# Patient Record
Sex: Female | Born: 1990 | Race: White | Hispanic: No | Marital: Married | Smoking: Current every day smoker
Health system: Southern US, Community
[De-identification: ages and names within clinical notes are randomized; demographics above are authoritative.]

---
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
## 2009-06-28 ENCOUNTER — Emergency Department (HOSPITAL_COMMUNITY): Admission: EM | Admit: 2009-06-28 | Discharge: 2009-06-29 | Payer: Self-pay | Admitting: Emergency Medicine

## 2009-06-30 ENCOUNTER — Ambulatory Visit (HOSPITAL_COMMUNITY): Admission: RE | Admit: 2009-06-30 | Discharge: 2009-06-30 | Payer: Self-pay | Admitting: Emergency Medicine

## 2010-11-18 LAB — POCT PREGNANCY, URINE: Preg Test, Ur: NEGATIVE

## 2010-11-18 LAB — COMPREHENSIVE METABOLIC PANEL
AST: 18 U/L (ref 0–37)
Chloride: 106 mEq/L (ref 96–112)
Creatinine, Ser: 0.73 mg/dL (ref 0.4–1.2)
GFR calc non Af Amer: >60 mL/min (ref >60)
Glucose, Bld: 100 mg/dL — ABNORMAL HIGH (ref 70–99)
Total Protein: 7.5 g/dL (ref 6.0–8.3)

## 2010-11-18 LAB — URINALYSIS, ROUTINE W REFLEX MICROSCOPIC
Bilirubin Urine: NEGATIVE
Hgb urine dipstick: NEGATIVE
Ketones, ur: NEGATIVE mg/dL
Nitrite: NEGATIVE
pH: 5.5 (ref 5.0–8.0)

## 2010-11-18 LAB — CBC
Hemoglobin: 15.1 g/dL — ABNORMAL HIGH (ref 12.0–15.0)
Platelets: 284 10*3/uL (ref 150–400)
RDW: 11.4 % — ABNORMAL LOW (ref 11.5–15.5)

## 2010-11-18 LAB — DIFFERENTIAL
Basophils Absolute: 0.1 10*3/uL (ref 0.0–0.1)
Basophils Relative: 2 % — ABNORMAL HIGH (ref 0–1)
Eosinophils Absolute: 0.6 10*3/uL (ref 0.0–0.7)
Lymphocytes Relative: 36 % (ref 12–46)
Lymphs Abs: 2.6 10*3/uL (ref 0.7–4.0)
Monocytes Absolute: 0.6 10*3/uL (ref 0.1–1.0)
Neutro Abs: 3.4 10*3/uL (ref 1.7–7.7)

## 2010-11-18 LAB — LIPASE, BLOOD: Lipase: 29 U/L (ref 11–59)

## 2011-01-01 NOTE — Discharge Summary (Signed)
Amy Tanner, Amy Tanner                 ACCOUNT NO.:  [id number]   MEDICAL RECORD NO.:  [id number]          PATIENT TYPE:  OBV   LOCATION:  5017                         FACILITY:  [facility]   PHYSICIAN:  Pediatrics Resident    DATE OF BIRTH:  [birth year]   DATE OF ADMISSION:  09/20/2006  DATE OF DISCHARGE:  09/21/2006                               DISCHARGE SUMMARY   REASON FOR ADMISSION:  Accidental rubbing alcohol ingestion, vomiting,  headache.   SIGNIFICANT FINDINGS:  The patient was admitted after accidental  ingestion of approximately 2 ounces of rubbing alcohol.  She had  continued vomiting and complained of abdominal pain.  CT of the abdomen  was negative except for minimal ground-glass opacity in the right lower  lobe.  A subsequent chest x-ray was negative.  The patient had normal  work of breathing with no respiratory difficulties. A CBC showed  slightly increased platelets and slightly increased white blood cell  count consistent with demarginalization.  A chemistry ISTAT and  urinalysis were all negative.   TREATMENT:  The patient was given IV hydration and Zofran as needed.  She received Toradol in the emergency department for headache.   OPERATION/PROCEDURE:  None.   FINAL DIAGNOSES:  1. Accidental rubbing alcohol ingestion.  2. Dehydration secondary to vomiting.   DISCHARGE MEDICATIONS/INSTRUCTIONS:  She should seek medical care if  she has any changes in mental status, continued vomiting, abdominal pain  or persistent headache.   FINDINGS/RESULTS TO BE FOLLOWED:  None.   FOLLOWUP:  She is to follow as needed with Dr. Lorin Picket at [facility].   DISCHARGE WEIGHT:  Discharge weight 145 pounds.   DISCHARGE CONDITION:  Good.  This was faxed to her primary care  physician, Dr. Lorin Picket, [facility] on September 21, 2006.           ______________________________  Pediatrics Resident     PR/MEDQ  D:  09/21/2006  T:  09/22/2006  Job:  [id number]

## 2011-05-25 LAB — CBC
HCT: 37.1
Hemoglobin: 12.9
MCHC: 34.7
MCV: 82.8
Platelets: 401 — ABNORMAL HIGH
RBC: 4.48
RDW: 12.2
WBC: 6.6

## 2011-05-25 LAB — URINALYSIS, ROUTINE W REFLEX MICROSCOPIC
Bilirubin Urine: NEGATIVE
Glucose, UA: NEGATIVE
Hgb urine dipstick: NEGATIVE
Ketones, ur: NEGATIVE
Nitrite: NEGATIVE
Protein, ur: NEGATIVE
Specific Gravity, Urine: 1.028
Urobilinogen, UA: 1
pH: 6.5

## 2011-05-25 LAB — BASIC METABOLIC PANEL WITH GFR
BUN: 11
CO2: 26
Calcium: 8.6
Chloride: 104
Creatinine, Ser: 0.7
Glucose, Bld: 106 — ABNORMAL HIGH
Potassium: 3.3 — ABNORMAL LOW
Sodium: 135

## 2011-05-25 LAB — DIFFERENTIAL
Basophils Absolute: 0
Basophils Relative: 1
Eosinophils Absolute: 0.2
Eosinophils Relative: 3
Monocytes Absolute: 0.7

## 2011-05-25 LAB — URINE MICROSCOPIC-ADD ON

## 2017-04-10 ENCOUNTER — Inpatient Hospital Stay (HOSPITAL_COMMUNITY)
Admission: AD | Admit: 2017-04-10 | Discharge: 2017-04-13 | DRG: 881 | Disposition: A | Discharge status: Home / Self Care | Payer: Commercial Managed Care - PPO | Source: Intra-hospital | Attending: Psychiatry | Admitting: Psychiatry

## 2017-04-10 DIAGNOSIS — F332 Major depressive disorder, recurrent severe without psychotic features: Secondary | ICD-10-CM | POA: Diagnosis not present

## 2017-04-10 DIAGNOSIS — Z885 Allergy status to narcotic agent status: Secondary | ICD-10-CM | POA: Diagnosis not present

## 2017-04-10 DIAGNOSIS — Z79899 Other long term (current) drug therapy: Secondary | ICD-10-CM

## 2017-04-10 DIAGNOSIS — F419 Anxiety disorder, unspecified: Secondary | ICD-10-CM | POA: Diagnosis present

## 2017-04-10 DIAGNOSIS — F1310 Sedative, hypnotic or anxiolytic abuse, uncomplicated: Secondary | ICD-10-CM | POA: Diagnosis not present

## 2017-04-10 DIAGNOSIS — F329 Major depressive disorder, single episode, unspecified: Secondary | ICD-10-CM | POA: Diagnosis present

## 2017-04-10 DIAGNOSIS — F39 Unspecified mood [affective] disorder: Secondary | ICD-10-CM | POA: Diagnosis not present

## 2017-04-10 DIAGNOSIS — G4700 Insomnia, unspecified: Secondary | ICD-10-CM | POA: Diagnosis not present

## 2017-04-10 DIAGNOSIS — Z91018 Allergy to other foods: Secondary | ICD-10-CM

## 2017-04-10 DIAGNOSIS — R059 Cough, unspecified: Secondary | ICD-10-CM

## 2017-04-10 DIAGNOSIS — R05 Cough: Secondary | ICD-10-CM

## 2017-04-10 DIAGNOSIS — Z915 Personal history of self-harm: Secondary | ICD-10-CM

## 2017-04-10 DIAGNOSIS — F17210 Nicotine dependence, cigarettes, uncomplicated: Secondary | ICD-10-CM | POA: Diagnosis present

## 2017-04-10 MED ORDER — MIRTAZAPINE 15 MG PO TABS
15.0000 mg | ORAL_TABLET | Freq: Every day | ORAL | Status: DC
  Administered 2017-04-10 – 2017-04-12 (×3): 15 mg via ORAL
  Filled 2017-04-10 (×6): qty 1

## 2017-04-10 MED ORDER — HYDROXYZINE HCL 25 MG PO TABS
25.0000 mg | ORAL_TABLET | Freq: Three times a day (TID) | ORAL | Status: DC | PRN
  Administered 2017-04-12: 25 mg via ORAL
  Filled 2017-04-10: qty 1

## 2017-04-10 MED ORDER — ALUM & MAG HYDROXIDE-SIMETH 200-200-20 MG/5ML PO SUSP: 30.0000 mL | ORAL | Status: DC | PRN

## 2017-04-10 MED ORDER — ACETAMINOPHEN 325 MG PO TABS: 650.0000 mg | ORAL_TABLET | Freq: Four times a day (QID) | ORAL | Status: DC | PRN

## 2017-04-10 MED ORDER — ESCITALOPRAM OXALATE 20 MG PO TABS
20.0000 mg | ORAL_TABLET | Freq: Every day | ORAL | Status: DC
  Administered 2017-04-11: 20 mg via ORAL
  Filled 2017-04-10 (×3): qty 1

## 2017-04-10 MED ORDER — MAGNESIUM HYDROXIDE 400 MG/5ML PO SUSP: 30.0000 mL | Freq: Every day | ORAL | Status: DC | PRN

## 2017-04-10 MED ORDER — OLANZAPINE 5 MG PO TABS
5.0000 mg | ORAL_TABLET | Freq: Every day | ORAL | Status: DC
  Administered 2017-04-10: 5 mg via ORAL
  Filled 2017-04-10 (×4): qty 1

## 2017-04-10 NOTE — BH Assessment (Signed)
Tele Assessment Note   Patient Name: Amy Tanner MRN: [id number] Referring Physician: Donnita Falls Location of Patient: BH-[id number] IP ADULT Location of Provider: Behavioral Health TTS Department  Galen Malkowski is an 26 y.o. female.   Summary per [facility] Assessment dated 04/10/17: "Patient was IVC'ed by husband.  She says he thinks that she has been up and down in her mood.  One minute happy then another minute irritable.  Patient says that she can tell that she has had some mood swings.  Patient says she has not had any SI for "several weeks."  She had an suicide attempt about 3-4 months ago after the birth of her daughter.  Patient was started on medications at [facility] at that time.  Patient denies current SI.  No plan or intention to harm herself.  Pt says she has no intentions to harm anyone else, no plan, no HI.  Patient denies any A/V hallucinations.  Patient has had a counselor at [facility] after the medication was started a few months ago.  She has no seen this counselor however for two months.  Patient currently not taking any medications becuase she did not feel it was doing her any good.  Patient denies any physical, emotional or sexual abuse history.  No hx of any SA issues.    Patient declines to have husband contacted at this time for additional information."    Diagnosis: Bipolar D/O; GAD  Past Medical History: No past medical history on file.  No past surgical history on file.  Family History: No family history on file.  Social History:  has no tobacco, alcohol, and drug history on file.  Additional Social History:  Alcohol / Drug Use Prescriptions: Kimberlee Nearing, REMERON History of alcohol / drug use?: No history of alcohol / drug abuse  CIWA:   COWS:    PATIENT STRENGTHS: (choose at least two) Average or above average intelligence Communication skills Supportive family/friends  Allergies: Allergies not on file  Home Medications:   No prescriptions prior to admission.    OB/GYN Status:  No LMP recorded.  General Assessment Data Location of Assessment: [facility] ([place]) TTS Assessment: Out of system Is this a Tele or Face-to-Face Assessment?: Tele Assessment Is this an Initial Assessment or a Re-assessment for this encounter?: Initial Assessment Marital status: Married Is patient pregnant?: Unknown Living Arrangements: Spouse/significant other, Children Can pt return to current living arrangement?: Yes Admission Status: Involuntary Is patient capable of signing voluntary admission?: Yes Referral Source: Self/Family/Friend Insurance type:  (MEDICAID)     Crisis Care Plan Living Arrangements: Spouse/significant other, Children Name of Psychiatrist:  ([facility] NOT SEEN FOR 2 [facility]) Name of Therapist:  (NONE REPORTED)  Education Status Is patient currently in school?:  (UTA) Highest grade of school patient has completed:  (UNK)  Risk to self with the past 6 months Suicidal Ideation: No-Not Currently/Within Last 6 Months Has patient been a risk to self within the past 6 months prior to admission? : Yes Suicidal Intent: No-Not Currently/Within Last 6 Months Has patient had any suicidal intent within the past 6 months prior to admission? : Yes Is patient at risk for suicide?: Yes Suicidal Plan?: No-Not Currently/Within Last 6 Months Has patient had any suicidal plan within the past 6 months prior to admission? : Yes Access to Means: Yes (PER RECORD, HAS A HANDGUN) Specify Access to Suicidal Means:  (NONE REPORTED CURRENTLY) What has been your use of drugs/alcohol within the last 12 months?:  (  NONE) Previous Attempts/Gestures: Yes How many times?:  (1 - 3 TO 4 MONTHS AGO) Other Self Harm Risks:  (NONE REPORTED) Triggers for Past Attempts: Unknown Intentional Self Injurious Behavior: None Family Suicide History: Unknown Recent stressful life event(s): Other (Comment) (BIRTH OF DAUGHTER  A FEW MONTHS AGO) Persecutory voices/beliefs?: No Depression: Yes Depression Symptoms: Tearfulness, Isolating, Feeling angry/irritable Substance abuse history and/or treatment for substance abuse?: No Suicide prevention information given to non-admitted patients: Not applicable  Risk to Others within the past 6 months Homicidal Ideation: No Does patient have any lifetime risk of violence toward others beyond the six months prior to admission? : No Thoughts of Harm to Others: Yes-Currently Present (PER HUSBAND, PT IS  A THREAT TO HER CHILDREN) Comment - Thoughts of Harm to Others:  (NO FURTHER DETAILS GIVEN) Current Homicidal Intent: No Current Homicidal Plan: No Access to Homicidal Means: Yes Describe Access to Homicidal Means:  (HANDGUN) Identified Victim:  (NOT REPORTED SPECIFICALLY) History of harm to others?: No (NONE REPORTED) Assessment of Violence: None Noted Does patient have access to weapons?: Yes (Comment) Criminal Charges Pending?: No Does patient have a court date: No Is patient on probation?: No  Psychosis Hallucinations: None noted Delusions: None noted  Mental Status Report Appearance/Hygiene:  (UTA) Eye Contact: Unable to Assess Motor Activity: Unable to assess Speech: Unable to assess Level of Consciousness: Alert Mood: Pleasant, Euthymic Affect: Labile Anxiety Level: Minimal Thought Processes: Coherent, Relevant Judgement: Unimpaired Orientation: Appropriate for developmental age Obsessive Compulsive Thoughts/Behaviors: Unable to Assess  Cognitive Functioning Concentration: Normal Memory: Recent Intact, Remote Intact IQ: Average Insight: see judgement above Impulse Control: Unable to Assess Appetite:  (UTA) Sleep:  (UTA) Vegetative Symptoms: Unable to Assess  ADLScreening [facility] Assessment Services) Patient's cognitive ability adequate to safely complete daily activities?: Yes Patient able to express need for assistance with ADLs?:  Yes Independently performs ADLs?: Yes (appropriate for developmental age)  Prior Inpatient Therapy Prior Inpatient Therapy: Yes Prior Therapy Dates:  (3 TO 4 MONTHS AGO) Prior Therapy Facilty/Provider(s):  ([facility]) Reason for Treatment:  (SI)  Prior Outpatient Therapy Prior Outpatient Therapy:  (UTA) Does patient have an ACCT team?: No Does patient have Intensive In-House Services?  : No Does patient have [facility] services? : No Does patient have P4CC services?: No  ADL Screening (condition at time of admission) Patient's cognitive ability adequate to safely complete daily activities?: Yes Patient able to express need for assistance with ADLs?: Yes Independently performs ADLs?: Yes (appropriate for developmental age)       Abuse/Neglect Assessment (Assessment to be complete while patient is alone) Physical Abuse: Denies Verbal Abuse: Denies Sexual Abuse: Denies Exploitation of patient/patient's resources: Denies Self-Neglect: Denies     Merchant navy officer (For Healthcare) Does Patient Have a Medical Advance Directive?:  (UTA)    Additional Information 1:1 In Past 12 Months?: Yes CIRT Risk: No Elopement Risk: No Does patient have medical clearance?: Yes     Disposition:  Disposition Initial Assessment Completed for this Encounter: Yes Disposition of Patient: Inpatient treatment program Type of inpatient treatment program: Adult  This service was provided via telemedicine using a 2-way, interactive audio and video technology.  Names of all persons participating in this telemedicine service and their role in this encounter. Name: Eloyse Hibma Role: Patient  Name:  Role:   Name:  Role:   Name:  Role:    Entered by: Niyanna Asch T 04/10/2017 7:55 PM

## 2017-04-11 ENCOUNTER — Encounter (HOSPITAL_COMMUNITY): Payer: Self-pay | Admitting: *Deleted

## 2017-04-11 DIAGNOSIS — F332 Major depressive disorder, recurrent severe without psychotic features: Secondary | ICD-10-CM

## 2017-04-11 DIAGNOSIS — F1310 Sedative, hypnotic or anxiolytic abuse, uncomplicated: Secondary | ICD-10-CM

## 2017-04-11 DIAGNOSIS — F17210 Nicotine dependence, cigarettes, uncomplicated: Secondary | ICD-10-CM

## 2017-04-11 DIAGNOSIS — G4700 Insomnia, unspecified: Secondary | ICD-10-CM

## 2017-04-11 DIAGNOSIS — F39 Unspecified mood [affective] disorder: Secondary | ICD-10-CM

## 2017-04-11 MED ORDER — OLANZAPINE 2.5 MG PO TABS
2.5000 mg | ORAL_TABLET | Freq: Every day | ORAL | Status: DC
  Administered 2017-04-11 – 2017-04-12 (×2): 2.5 mg via ORAL
  Filled 2017-04-11 (×4): qty 1

## 2017-04-11 MED ORDER — ESCITALOPRAM OXALATE 10 MG PO TABS
10.0000 mg | ORAL_TABLET | Freq: Every day | ORAL | Status: DC
  Administered 2017-04-12 – 2017-04-13 (×2): 10 mg via ORAL
  Filled 2017-04-11 (×4): qty 1

## 2017-04-11 NOTE — Progress Notes (Signed)
Nursing Progress Note [id number]  D) Patient presents pleasant, calm and cooperative. Patient adjusting to the unit without questions or concerns. Patient currently denies SI/HI/AVH or pain. Patient requests medications for sleep. Patient has a non-productive cough and reports, "I was on Augmentin and Prednisone at home for pneumonia, but I don't remember how much I was taking". Patient contracts for safety on the unit. Patient reports she will discuss medications tomorrow with the provider.  A) Emotional support given. 1:1 interaction and active listening provided. Patient medicated as prescribed. Medications and plan of care reviewed with patient. Patient verbalized understanding without further questions. Snacks and fluids provided. Opportunities for questions or concerns presented to patient. Patient encouraged to continue to work on treatment goals. Labs, vital signs and patient behavior monitored throughout shift. Patient safety maintained with q15 min safety checks. Low fall risk precautions in place and reviewed with patient; patient verbalized understanding.  R) Patient receptive to interaction with nurse. Patient remains safe on the unit at this time. Patient denies any adverse medication reactions at this time. Patient is resting in bed without complaints. Will continue to monitor.

## 2017-04-11 NOTE — H&P (Signed)
Psychiatric Admission Assessment Adult  Patient Identification: Amy Tanner MRN:  [id number] Date of Evaluation:  04/11/2017 Chief Complaint:  MDD Principal Diagnosis: <principal problem not specified> Diagnosis:   Patient Active Problem List   Diagnosis Date Noted  . MDD (major depressive disorder) [F32.9] 04/10/2017   History of Present Illness: Per assessment note by RN: Amy Tanner a 26 year old female who presents IVC, in no acute distress, for the treatment of Depression and Anxiety. Patient appears pleasant and anxious on admission. Patient was cooperative with admission process. Patient denies SI and contracts for safety upon admission. Patient denies AVH.  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  Patient reports that she thinks that she may have "Bipolar".  Patient reports "I have been having mood swings lately. I have my highs and lows. I'm happy and then depressed".  Patient is currently an RN in the Cardiac ICU and fears this admission will affect her current job.  Patient currently lives her husband and children and identify them as her support system.  While at [facility], patient's goal is to "feel better" and "Try to get emotions under control".  Skin was assessed and found to be clear of any abnormal marks.  Patient searched and no contraband found, POC and unit policies explained and understanding verbalized. Consents obtained.  Patient had no additional questions or concerns.  On Evaluation: Amy Tanner is awake, alert and oriented. Reports she and her husband got into a disagreement and he called her parents how then decided to IVC her. Reports her mood has been all over the place. Patient reports she was followed by Dr. Lorriane Shire for outpatient services. Seriah currently  denies suicidal or homicidal ideation during this assessment. Denies auditory  or visual hallucination and does not appear to be responding to internal stimuli. Patient reports she was medication compliant for the first 3 months and feels like the medication is not helping. States her depression 2/10 and she has mood swings often. Patient report she is excited regarding discharge. Support, encouragement and reassurance was provided.    Associated Signs/Symptoms: Depression Symptoms:  depressed mood, difficulty concentrating, anxiety, (Hypo) Manic Symptoms:  Distractibility, Irritable Mood, Anxiety Symptoms:  Excessive Worry, Psychotic Symptoms:  Hallucinations: None PTSD Symptoms: Avoidance:  Decreased Interest/Participation Total Time spent with patient: 30 minutes  Past Psychiatric History:  Is the patient at risk to self? Yes.    Has the patient been a risk to self in the past 6 months? Yes.    Has the patient been a risk to self within the distant past? Yes.    Is the patient a risk to others? No.  Has the patient been a risk to others in the past 6 months? No.  Has the patient been a risk to others within the distant past? No.   Prior Inpatient Therapy: Prior Inpatient Therapy: Yes Prior Therapy Dates:  (3 TO 4 MONTHS AGO) Prior Therapy Facilty/Provider(s):  ([facility]) Reason for Treatment:  (SI) Prior Outpatient Therapy: Prior Outpatient Therapy:  ([facility]) Does patient have an ACCT team?: No Does patient have Intensive In-House Services?  : No Does patient have [facility] services? : No Does patient have P4CC services?: No  Alcohol Screening: 1. How often do you have a drink containing alcohol?: Monthly or less 2. How many drinks containing alcohol do you have on a typical day  when you are drinking?: 1 or 2 3. How often do you have six or more drinks on one occasion?: Never Preliminary Score: 0 9. Have you or someone else been injured as a result of your drinking?: No 10. Has a relative or friend or a doctor or another health worker been concerned about  your drinking or suggested you cut down?: No Alcohol Use Disorder Identification Test Final Score (AUDIT): 1 Brief Intervention: AUDIT score less than 7 or less-screening does not suggest unhealthy drinking-brief intervention not indicated Substance Abuse History in the last 12 months:  No. Consequences of Substance Abuse: NA Previous Psychotropic Medications: yes Psychological Evaluations: yes Past Medical History: History reviewed. No pertinent past medical history. History reviewed. No pertinent surgical history. Family History: History reviewed. No pertinent family history. Family Psychiatric  History: Reports grandmother has depression  Tobacco Screening: Have you used any form of tobacco in the last 30 days? (Cigarettes, Smokeless Tobacco, Cigars, and/or Pipes): Yes Tobacco use, Select all that apply: 5 or more cigarettes per day Are you interested in Tobacco Cessation Medications?: No, patient refused Counseled patient on smoking cessation including recognizing danger situations, developing coping skills and basic information about quitting provided: Yes Social History:  History  Alcohol use Not on file     History  Drug use: Unknown    Additional Social History: Marital status: Married    Prescriptions: Eulis Foster, REMERON History of alcohol / drug use?: No history of alcohol / drug abuse                    Allergies:   Allergies  Allergen Reactions  . Codeine Anaphylaxis  . Grapefruit Concentrate Rash   Lab Results: No results found for this or any previous visit (from the past 48 hour(s)).  Blood Alcohol level:  No results found for: [facility]  Metabolic Disorder Labs:  No results found for: HGBA1C, MPG No results found for: PROLACTIN No results found for: CHOL, TRIG, HDL, CHOLHDL, VLDL, LDLCALC  Current Medications: Current Facility-Administered Medications  Medication Dose Route Frequency Provider Last Rate Last Dose  . acetaminophen (TYLENOL) tablet  650 mg  650 mg Oral Q6H PRN Okonkwo, Justina A, NP      . alum & mag hydroxide-simeth (MAALOX/MYLANTA) 200-200-20 MG/5ML suspension 30 mL  30 mL Oral Q4H PRN Okonkwo, Justina A, NP      . escitalopram (LEXAPRO) tablet 20 mg  20 mg Oral Daily Okonkwo, Justina A, NP   20 mg at 04/11/17 4114  . hydrOXYzine (ATARAX/VISTARIL) tablet 25 mg  25 mg Oral TID PRN Lu Duffel, Justina A, NP      . magnesium hydroxide (MILK OF MAGNESIA) suspension 30 mL  30 mL Oral Daily PRN Okonkwo, Justina A, NP      . mirtazapine (REMERON) tablet 15 mg  15 mg Oral QHS Okonkwo, Justina A, NP   15 mg at 04/10/17 2330  . OLANZapine (ZYPREXA) tablet 5 mg  5 mg Oral QHS Okonkwo, Justina A, NP   5 mg at 04/10/17 2330   PTA Medications: Prescriptions Prior to Admission  Medication Sig Dispense Refill Last Dose  . amoxicillin-clavulanate (AUGMENTIN) 875-125 MG tablet Take 1 tablet by mouth 2 (two) times daily.  0 04/09/2017  . predniSONE (DELTASONE) 20 MG tablet Take 20 mg by mouth 2 (two) times daily.  0 04/09/2017    Musculoskeletal: Strength & Muscle Tone: within normal limits Gait & Station: normal Patient leans: N/A  Psychiatric Specialty Exam: Physical Exam  ROS  Blood pressure 110/76, pulse (!) 107, temperature (!) 97.3 F (36.3 C), temperature source Oral, resp. rate 16, height '5\' 7"'  (1.702 m), weight 81.6 kg (180 lb), SpO2 96 %.Body mass index is 28.19 kg/m.  General Appearance: Casual  Eye Contact:  Fair  Speech:  Clear and Coherent  Volume:  Normal  Mood:  Anxious and Depressed  Affect:  Depressed  Thought Process:  Coherent  Orientation:  Full (Time, Place, and Person)  Thought Content:  Hallucinations: None  Suicidal Thoughts:  No  Homicidal Thoughts:  No  Memory:  Immediate;   Fair Recent;   Fair Remote;   Fair  Judgement:  Fair  Insight:  Present  Psychomotor Activity:  Normal  Concentration:  Concentration: Fair and Attention Span: Fair  Recall:  [employer] of Knowledge:  Fair  Language:  Good   Akathisia:  No  Handed:  Right  AIMS (if indicated):     Assets:  Desire for Improvement Housing Intimacy Resilience Social Support  ADL's:  Intact  Cognition:  WNL  Sleep:  Number of Hours: 5.25     I agree with current treatment plan on 04/11/2017, Patient seen face-to-face for psychiatric evaluation follow-up, chart reviewed and case discussed with the MD [place]. Reviewed the information documented and agree with the treatment plan.   Treatment Plan Summary: Daily contact with patient to assess and evaluate symptoms and progress in treatment and Medication management  Continue with Lexapro 40m for mood stabilization. Continue with Remeron 15  for insomnia  Will continue to monitor vitals ,medication compliance and treatment side effects while patient is here.  Reviewed labs:,BAL - , UDS -benzodizpines. CSW will start working on disposition.  Patient to participate in therapeutic milieu  Observation Level/Precautions:  15 minute checks  Laboratory:  CBC Chemistry Profile HbAIC UDS  Psychotherapy:  Individual and group session   Medications:  See Above  Consultations: Psychiatry    Discharge Concerns:  Safety, stabilization, and risk of access to medication and medication stabilization   Estimated LOS: 5-7 day  Other:     Physician Treatment Plan for Primary Diagnosis: <principal problem not specified> Long Term Goal(s): Improvement in symptoms so as ready for discharge  Short Term Goals: Ability to identify changes in lifestyle to reduce recurrence of condition will improve, Ability to verbalize feelings will improve, Ability to demonstrate self-control will improve and Ability to maintain clinical measurements within normal limits will improve  Physician Treatment Plan for Secondary Diagnosis: Active Problems:   MDD (major depressive disorder)  Long Term Goal(s): Improvement in symptoms so as ready for discharge  Short Term Goals: Ability to identify changes  in lifestyle to reduce recurrence of condition will improve, Ability to identify and develop effective coping behaviors will improve, Compliance with prescribed medications will improve and Ability to identify triggers associated with substance abuse/mental health issues will improve  I certify that inpatient services furnished can reasonably be expected to improve the patient's condition.    [place] NP 8/27/201810:30 AM   I have reviewed case with NP and have met with patient Agree with NP note and assessment  26year old female, married. 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 States this was probably related to an argument with her husband that day. States " my husband and my family think that ever since I had postpartum depression I still have mood swings and mood  instability". She states she does agree that her mood " fluctuates a lot", but states " I am not suicidal or homicidal or anything like that, I don't think I needed to be committed ". Reports neuro-vegetative symptoms of depression- anhedonia, sadness, decreased sense of self esteem, fair sleep. Does not endorse, or present with , any clear symptoms of mania or hypomania at this time.  Reports episode of post partum depression in January , after birth of her daughter. Was treated with Remeron, Lexapro, Xanax, and felt better. States she stopped these medications 2-3 months ago because she was feeling better .  Denies alcohol or drug abuse , recent diagnosis of pneumonia last week.   Denies prior psychiatric admissions , history of post partum depression , history of a prior overdose attempt in March 2018. Denies history of psychosis. Denies any clear history of mania or hypomania, and reports mood swings of short duration, feeling vaguely " irritable" at times .        Denies any medical illnesses , not currently breast feeding   Dx-  MDD, without  psychotic symptoms  Plan- Inpatient admission- she has been restarted on Lexapro and Remeron, which she had been taking in the past with good response . Was also started on Zyprexa for mood disorder, we reviewed side effects and rationale, will decrease dose to 2.5 mgrs QHS to minimize side effects and titrate gradually if needed.   Patient reports she was diagnosed with pneumonia about 1 week ago, took Augmentin and Prednisone x 3 days , but stopped 3 days ago when she went to ED. No current fever or dyspnea. I have discussed case with hospitalist- will check CBC, and Chest X Ray- at this time will not restart antibiotic.

## 2017-04-11 NOTE — Plan of Care (Signed)
Problem: Safety: Goal: Periods of time without injury will increase Outcome: Progressing Patient is on q15 minute safety checks and low fall risk precautions. Patient contracts for safety on the unit and remains safe at this time.   

## 2017-04-11 NOTE — BHH Group Notes (Signed)
LCSW Group Therapy Note   04/11/2017 1:15pm   Type of Therapy and Topic:  Group Therapy:  Overcoming Obstacles   Participation Level:  None   Description of Group:    In this group patients will be encouraged to explore what they see as obstacles to their own wellness and recovery. They will be guided to discuss their thoughts, feelings, and behaviors related to these obstacles. The group will process together ways to cope with barriers, with attention given to specific choices patients can make. Each patient will be challenged to identify changes they are motivated to make in order to overcome their obstacles. This group will be process-oriented, with patients participating in exploration of their own experiences as well as giving and receiving support and challenge from other group members.   Therapeutic Goals: 1. Patient will identify personal and current obstacles as they relate to admission. 2. Patient will identify barriers that currently interfere with their wellness or overcoming obstacles.  3. Patient will identify feelings, thought process and behaviors related to these barriers. 4. Patient will identify two changes they are willing to make to overcome these obstacles:      Summary of Patient Progress Pt did not participate in group discussion.    Therapeutic Modalities:   Cognitive Behavioral Therapy Solution Focused Therapy Motivational Interviewing Relapse Prevention Therapy  Verdene Lennert, LCSW 04/11/2017 5:01 PM

## 2017-04-11 NOTE — Tx Team (Signed)
Initial Treatment Plan 04/11/2017 2:35 AM Amy Tanner HWT:[id number]    PATIENT STRESSORS: Marital or family conflict Other: Postpartem Depression hx   PATIENT STRENGTHS: Ability for insight Average or above average intelligence Capable of independent living Metallurgist fund of knowledge Physical Health Supportive family/friends   PATIENT IDENTIFIED PROBLEMS: Depression  Anxiety  "Feel better"  "Try to get emotions under control"               DISCHARGE CRITERIA:  Ability to meet basic life and health needs Improved stabilization in mood, thinking, and/or behavior Motivation to continue treatment in a less acute level of care Need for constant or close observation no longer present  PRELIMINARY DISCHARGE PLAN: Outpatient therapy Return to previous living arrangement Return to previous work or school arrangements  PATIENT/FAMILY INVOLVEMENT: This treatment plan has been presented to and reviewed with the patient, Amy Tanner.  The patient and family have been given the opportunity to ask questions and make suggestions.  Carleene Overlie, RN 04/11/2017, 2:35 AM

## 2017-04-11 NOTE — Plan of Care (Signed)
Problem: Activity: Goal: Imbalance in normal sleep/wake cycle will improve Outcome: Not Progressing Patient reports poor sleep last night, states that this might have been because of her late admission to the unit.  Patient encouraged to let staff know if she is not able to sleep tonight and verbalizes understanding.  Comments: Patient with blunted affect, is cooperative with care, denies SI/HI/AVH, took AM meds, ate breakfast and lunch.  Pt requesting Prednisone and Augmentin, Physician notified, will continue to monitor on Q15 minute checks.

## 2017-04-11 NOTE — Progress Notes (Signed)
Nursing Progress Note [id number]  D) Patient presents calm, pleasant and cooperative. Patient has an animated affect and has no complaints for writer this evening. Patient reports she had a "nice visit" with her husband this evening. Patient was observed up in the milieu and attended group. Patient denies SI/HI/AVH or pain and contracts for safety on the unit. Patient medicated as prescribed.  A) Emotional support given. 1:1 interaction and active listening provided. Patient medicated as prescribed. Medications and plan of care reviewed with patient. Patient verbalized understanding without further questions. Snacks and fluids provided. Opportunities for questions or concerns presented to patient. Patient encouraged to continue to work on treatment goals. Labs, vital signs and patient behavior monitored throughout shift. Patient safety maintained with q15 min safety checks. Low fall risk precautions in place and reviewed with patient; patient verbalized understanding.  R) Patient receptive to interaction with nurse. Patient remains safe on the unit at this time. Patient is resting in bed without complaints. Will continue to monitor.

## 2017-04-11 NOTE — Progress Notes (Signed)
Admission Note:  26 year old female who presents IVC, in no acute distress, for the treatment of Depression and Anxiety. Patient appears pleasant and anxious on admission. Patient was cooperative with admission process. Patient denies SI and contracts for safety upon admission. Patient denies AVH.  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  Patient reports that she thinks that she may have "Bipolar".  Patient reports "I have been having mood swings lately. I have my highs and lows. I'm happy and then depressed".  Patient is currently an RN in the Cardiac ICU and fears this admission will affect her current job.  Patient currently lives her husband and children and identify them as her support system.  While at [facility], patient's goal is to "feel better" and "Try to get emotions under control".  Skin was assessed and found to be clear of any abnormal marks.  Patient searched and no contraband found, POC and unit policies explained and understanding verbalized. Consents obtained.  Patient had no additional questions or concerns.

## 2017-04-11 NOTE — [facility] Suicide Risk Assessment (Addendum)
[facility] Admission Suicide Risk Assessment   Nursing information obtained from:  Patient Demographic factors:  Caucasian, Access to firearms Current Mental Status:  NA Loss Factors:  NA Historical Factors:  Prior suicide attempts, Family history of mental illness or substance abuse Risk Reduction Factors:  Responsible for children under 27 years of age, Employed, Living with another person, especially a relative, Positive social support  Total Time spent with patient: 45 minutes Principal Problem: MDD (major depressive disorder) Diagnosis:   Patient Active Problem List   Diagnosis Date Noted  . MDD (major depressive disorder) [F32.9] 04/10/2017    Continued Clinical Symptoms:  Alcohol Use Disorder Identification Test Final Score (AUDIT): 1 The "Alcohol Use Disorders Identification Test", Guidelines for Use in Primary Care, Second Edition.  [employer] writer [facility]). Score between 0-7:  no or low risk or alcohol related problems. Score between 8-15:  moderate risk of alcohol related problems. Score between 16-19:  high risk of alcohol related problems. Score 20 or above:  warrants further diagnostic evaluation for alcohol dependence and treatment.   CLINICAL FACTORS:  26 year old female, married. Employed Editor, commissioning), has two children ( 56 y old, 19 months old)  She was admitted under commitment by husband. States this was probably related to an argument with her husband that day. States " my husband and my family think that ever since I had postpartum depression I still have mood swings and mood instability". She states she does agree that her mood " fluctuates a lot", but states " I am not suicidal or homicidal or anything like that, I don't think I needed to be committed ". Reports neuro-vegetative symptoms of depression- anhedonia, sadness, decreased sense of self esteem, fair sleep. Does not endorse, or present with , any clear symptoms of mania or hypomania at this time.  Reports  episode of post partum depression in January , after birth of her daughter. Was treated with Remeron, Lexapro, Xanax, and felt better. States she stopped these medications 2-3 months ago because she was feeling better .  Denies alcohol or drug abuse , recent diagnosis of pneumonia last week.   Denies prior psychiatric admissions , history of post partum depression , history of a prior overdose attempt in March 2018. Denies history of psychosis. Denies any clear history of mania or hypomania, and reports mood swings of short duration, feeling vaguely " irritable" at times .        Denies any medical illnesses , not currently breast feeding   Dx-  MDD, without psychotic symptoms  Plan- Inpatient admission- she has been restarted on Lexapro and Remeron, which she had been taking in the past with good response . Was also started on Zyprexa for mood disorder, we reviewed side effects and rationale, will decrease dose to 2.5 mgrs QHS to minimize side effects and titrate gradually if needed.   Patient reports she was diagnosed with pneumonia about 1 week ago, took Augmentin and Prednisone x 3 days , but stopped 3 days ago when she went to ED. No current fever or dyspnea. I have discussed case with hospitalist- will check CBC, and Chest X Ray- at this time will not restart antibiotic.     Musculoskeletal: Strength & Muscle Tone: within normal limits Gait & Station: normal Patient leans: N/A  Psychiatric Specialty Exam: Physical Exam  ROS no headache, no chest pain, no shortness of breath, no vomiting   Blood pressure 110/76, pulse (!) 107, temperature (!) 97.3 F (36.3 C), temperature  source Oral, resp. rate 16, height 5\' 7"  (1.702 m), weight 81.6 kg (180 lb), SpO2 96 %.Body mass index is 28.19 kg/m.  General Appearance: Well Groomed  Eye Contact:  Good  Speech:  Normal Rate  Volume:  decreased , vaguely hoarse   Mood:  reports " mood is OK today". Denies severe depression at this time    Affect:  mildly constricted, but reactive, not irritable or expansive  Thought Process:  Linear and Descriptions of Associations: Intact  Orientation:  Other:  fully alert and attentive  Thought Content:  denies hallucinations, no delusions, not internally preoccupied   Suicidal Thoughts:  No denies any suicidal or self injurious ideations, denies any homicidal or violent ideations. Contracts for safety on unit   Homicidal Thoughts:  No  Memory:  recent and remote grossly intact   Judgement:  Fair  Insight:  Fair  Psychomotor Activity:  Normal  Concentration:  Concentration: Good and Attention Span: Good  Recall:  Good  Fund of Knowledge:  Good  Language:  Good  Akathisia:  Negative  Handed:  Right  AIMS (if indicated):     Assets:  Communication Skills Desire for Improvement Resilience  ADL's:  Intact  Cognition:  WNL  Sleep:  Number of Hours: 5.25      COGNITIVE FEATURES THAT CONTRIBUTE TO RISK:  Closed-mindedness and Loss of executive function    SUICIDE RISK:   Moderate:  Frequent suicidal ideation with limited intensity, and duration, some specificity in terms of plans, no associated intent, good self-control, limited dysphoria/symptomatology, some risk factors present, and identifiable protective factors, including available and accessible social support.  PLAN OF CARE: Patient will be admitted to inpatient psychiatric unit for stabilization and safety. Will provide and encourage milieu participation. Provide medication management and maked adjustments as needed.  Will follow daily.    I certify that inpatient services furnished can reasonably be expected to improve the patient's condition.   Craige Cotta, MD 04/11/2017, 12:19 PM

## 2017-04-11 NOTE — Progress Notes (Signed)
Recreation Therapy Notes  Date: 04/11/17 Time: 0915 Location: 300 Hall Group Room  Group Topic: Stress Management  Goal Area(s) Addresses:  Patient will verbalize importance of using healthy stress management.  Patient will identify positive emotions associated with healthy stress management.   Behavioral Response: Engaged  Intervention: Stress Management  Activity :  Body Scan Meditation.  LRT introduced the stress management technique of meditation.  LRT played a meditation from the Calm app to allow patients to take inventory of any tension, calmness or other sensations they may have been feeling.  Patients were to follow along as the meditation played.  Education:  Stress Management, Discharge Planning.   Education Outcome: Acknowledges edcuation/In group clarification offered/Needs additional education  Clinical Observations/Feedback: Pt attended group.   Amy Tanner, LRT/CTRS         Amy Tanner A 04/11/2017 11:45 AM 

## 2017-04-12 ENCOUNTER — Ambulatory Visit (HOSPITAL_COMMUNITY): Payer: Commercial Managed Care - PPO

## 2017-04-12 LAB — CBC WITH DIFFERENTIAL/PLATELET
Basophils Absolute: 0 K/uL (ref 0.0–0.1)
Basophils Relative: 0 %
Eosinophils Absolute: 0.4 K/uL (ref 0.0–0.7)
Eosinophils Relative: 4 %
HCT: 40.4 % (ref 36.0–46.0)
Hemoglobin: 13.8 g/dL (ref 12.0–15.0)
Lymphocytes Relative: 39 %
Lymphs Abs: 3.8 K/uL (ref 0.7–4.0)
MCH: 27.9 pg (ref 26.0–34.0)
MCHC: 34.2 g/dL (ref 30.0–36.0)
MCV: 81.6 fL (ref 78.0–100.0)
Monocytes Absolute: 0.7 K/uL (ref 0.1–1.0)
Monocytes Relative: 7 %
Neutro Abs: 5 K/uL (ref 1.7–7.7)
Neutrophils Relative %: 50 %
Platelets: 398 K/uL (ref 150–400)
RBC: 4.95 MIL/uL (ref 3.87–5.11)
RDW: 12.9 % (ref 11.5–15.5)
WBC: 9.9 K/uL (ref 4.0–10.5)

## 2017-04-12 LAB — TSH: TSH: 0.846 u[IU]/mL (ref 0.350–4.500)

## 2017-04-12 MED ORDER — GUAIFENESIN-DM 100-10 MG/5ML PO SYRP
10.0000 mL | ORAL_SOLUTION | Freq: Four times a day (QID) | ORAL | Status: DC | PRN
  Administered 2017-04-12 – 2017-04-13 (×3): 10 mL via ORAL
  Filled 2017-04-12 (×3): qty 10

## 2017-04-12 MED ORDER — AMOXICILLIN-POT CLAVULANATE 875-125 MG PO TABS
1.0000 | ORAL_TABLET | Freq: Two times a day (BID) | ORAL | Status: DC
  Administered 2017-04-12 – 2017-04-13 (×2): 1 via ORAL
  Filled 2017-04-12 (×6): qty 1

## 2017-04-12 NOTE — [facility] Group Notes (Signed)
LCSW Group Therapy Note  04/12/2017 1:15pm  Type of Therapy/Topic:  Group Therapy:  Feelings about Diagnosis  Participation Level:  Minimal   Description of Group:   This group will allow patients to explore their thoughts and feelings about diagnoses they have received. Patients will be guided to explore their level of understanding and acceptance of these diagnoses. Facilitator will encourage patients to process their thoughts and feelings about the reactions of others to their diagnosis and will guide patients in identifying ways to discuss their diagnosis with significant others in their lives. This group will be process-oriented, with patients participating in exploration of their own experiences, giving and receiving support, and processing challenge from other group members.   Therapeutic Goals: 1. Patient will demonstrate understanding of diagnosis as evidenced by identifying two or more symptoms of the disorder 2. Patient will be able to express two feelings regarding the diagnosis 3. Patient will demonstrate their ability to communicate their needs through discussion and/or role play  Summary of Patient Progress: Pt did not participate in group discussion but was attentive and participated in the group activity.    Therapeutic Modalities:   Cognitive Behavioral Therapy Brief Therapy Feelings Identification    Verdene Lennert, LCSW 04/12/2017 2:55 PM

## 2017-04-12 NOTE — Progress Notes (Signed)
Nursing Progress Note 1900-0730  D) Patient presents with animated, bright affect and reports, "I feel so much better today, I have had a great day". Patient states, "I hope I can go home tomorrow, maybe I will if I feel this good again". Patient states, "I think the medicine and the groups are helping. Interacting with everyone takes my mind off things". Patient was observed up in the milieu playing cards with peers and attended group. Patient denies SI/HI/AVH or pain and contracts for safety on the unit. Patient medicated as prescribed.  A) Emotional support given. 1:1 interaction and active listening provided. Patient medicated as prescribed. Medications and plan of care reviewed with patient. Patient verbalized understanding without further questions. Snacks and fluids provided. Opportunities for questions or concerns presented to patient. Patient encouraged to continue to work on treatment goals. Labs, vital signs and patient behavior monitored throughout shift. Patient safety maintained with q15 min safety checks. Low fall risk precautions in place and reviewed with patient; patient verbalized understanding.  R) Patient receptive to interaction with nurse. Patient remains safe on the unit at this time. Patient denies any adverse medication reactions at this time. Patient is resting in bed without complaints. Will continue to monitor.

## 2017-04-12 NOTE — Progress Notes (Signed)
[facility] MD Progress Note  04/12/2017 9:04 AM Amy Tanner  MRN:  [id number] Subjective:  Patient reports she is feeling "OK".  Denies any suicidal ideations. Denies irritability, and states " I am doing pretty good today". She states she had a good visit with her husband yesterday. Denies medication side effects. Objective : I have discussed case with treatment plan and I have met with patient Patient presents with improving mood and full range of affect , denies suicidal ideations. No explosive, disruptive or agitated behaviors on unit, going to groups. With patient's express consent and in her presence I spoke with her husband via phone- he visited yesterday and  corroborates that she presents improved . He reports that she has had ongoing issues with irritability, which had been worse recently,and that she continues to focus / ruminate  on issues that occurred months ago. Patient reports that they had a brief separation last year, and that she has continued to have concerns about what happened during their separation. Regarding recent increased mood symptoms she states that recent steroid course for respiratory infection "made my mood worse".  She states that her husband and her are currently in couples therapy .  Denies medication side effects. Labs reviewed- WBC 9.9 , TSH WNL. Patient presenting with gradually improving cough, no fever, no chills , no dyspnea.   Principal Problem: MDD (major depressive disorder) Diagnosis:   Patient Active Problem List   Diagnosis Date Noted  . MDD (major depressive disorder) [F32.9] 04/10/2017   Total Time spent with patient: 20 minutes  Past Medical History: History reviewed. No pertinent past medical history. History reviewed. No pertinent surgical history. Family History: History reviewed. No pertinent family history. Social History:  History  Alcohol use Not on file     History  Drug use: Unknown    Social History   Social History  . Marital  status: Married    Spouse name: N/A  . Number of children: N/A  . Years of education: N/A   Social History Main Topics  . Smoking status: Current Every Day Smoker  . Smokeless tobacco: Never Used  . Alcohol use None  . Drug use: Unknown  . Sexual activity: Not Asked   Other Topics Concern  . None   Social History Narrative  . None   Additional Social History:    Prescriptions: Eulis Foster, REMERON History of alcohol / drug use?: No history of alcohol / drug abuse  Sleep: Good  Appetite:  Good  Current Medications: Current Facility-Administered Medications  Medication Dose Route Frequency Provider Last Rate Last Dose  . acetaminophen (TYLENOL) tablet 650 mg  650 mg Oral Q6H PRN Okonkwo, Justina A, NP      . alum & mag hydroxide-simeth (MAALOX/MYLANTA) 200-200-20 MG/5ML suspension 30 mL  30 mL Oral Q4H PRN Okonkwo, Justina A, NP      . escitalopram (LEXAPRO) tablet 10 mg  10 mg Oral Daily Cobos, Myer Peer, MD   10 mg at 04/12/17 8453  . hydrOXYzine (ATARAX/VISTARIL) tablet 25 mg  25 mg Oral TID PRN Lu Duffel, Justina A, NP      . magnesium hydroxide (MILK OF MAGNESIA) suspension 30 mL  30 mL Oral Daily PRN Okonkwo, Justina A, NP      . mirtazapine (REMERON) tablet 15 mg  15 mg Oral QHS Okonkwo, Justina A, NP   15 mg at 04/11/17 2228  . OLANZapine (ZYPREXA) tablet 2.5 mg  2.5 mg Oral QHS Cobos, Myer Peer, MD   2.5  mg at 04/11/17 2227    Lab Results:  Results for orders placed or performed during the hospital encounter of 04/10/17 (from the past 48 hour(s))  TSH     Status: None   Collection Time: 04/12/17  6:47 AM  Result Value Ref Range   TSH 0.846 0.350 - 4.500 uIU/mL    Comment: Performed by a 3rd Generation assay with a functional sensitivity of <=0.01 uIU/mL. Performed at [facility], [place] [address] [place], [place] [address]   CBC with Differential/Platelet     Status: None   Collection Time: 04/12/17  6:47 AM  Result Value Ref Range   WBC  9.9 4.0 - 10.5 K/uL   RBC 4.95 3.87 - 5.11 MIL/uL   Hemoglobin 13.8 12.0 - 15.0 g/dL   HCT 40.4 36.0 - 46.0 %   MCV 81.6 78.0 - 100.0 fL   MCH 27.9 26.0 - 34.0 pg   MCHC 34.2 30.0 - 36.0 g/dL   RDW 12.9 11.5 - 15.5 %   Platelets 398 150 - 400 K/uL   Neutrophils Relative % 50 %   Neutro Abs 5.0 1.7 - 7.7 K/uL   Lymphocytes Relative 39 %   Lymphs Abs 3.8 0.7 - 4.0 K/uL   Monocytes Relative 7 %   Monocytes Absolute 0.7 0.1 - 1.0 K/uL   Eosinophils Relative 4 %   Eosinophils Absolute 0.4 0.0 - 0.7 K/uL   Basophils Relative 0 %   Basophils Absolute 0.0 0.0 - 0.1 K/uL    Comment: Performed at [facility], [place] [address] [place],  [address]    Blood Alcohol level:  No results found for: [facility]  Metabolic Disorder Labs: No results found for: HGBA1C, MPG No results found for: PROLACTIN No results found for: CHOL, TRIG, HDL, CHOLHDL, VLDL, LDLCALC  Physical Findings: AIMS: Facial and Oral Movements Muscles of Facial Expression: None, normal Lips and Perioral Area: None, normal Jaw: None, normal Tongue: None, normal,Extremity Movements Upper (arms, wrists, hands, fingers): None, normal Lower (legs, knees, ankles, toes): None, normal, Trunk Movements Neck, shoulders, hips: None, normal, Overall Severity Severity of abnormal movements (highest score from questions above): None, normal Incapacitation due to abnormal movements: None, normal Patient's awareness of abnormal movements (rate only patient's report): No Awareness, Dental Status Current problems with teeth and/or dentures?: No Does patient usually wear dentures?: No  CIWA:    COWS:     Musculoskeletal: Strength & Muscle Tone: within normal limits Gait & Station: normal Patient leans: N/A  Psychiatric Specialty Exam: Physical Exam  ROS  Blood pressure 118/83, pulse 91, temperature 97.6 F (36.4 C), temperature source Oral, resp. rate 16, height '5\' 7"'  (1.702 m), weight 81.6 kg (180 lb), SpO2  96 %.Body mass index is 28.19 kg/m.  General Appearance: Well Groomed  Eye Contact:  Good  Speech:  Normal Rate  Volume:  Normal  Mood:  reports she is feeling better  Affect:  appropriate, reactive, smiles at times appropriately, no irritability or expanisveness  noted   Thought Process:  Linear and Descriptions of Associations: Intact  Orientation:  Full (Time, Place, and Person)  Thought Content:  no hallucinations, no delusions,not internally preoccupied   Suicidal Thoughts:  No denies suicidal or self injurious ideations, no homicidal or violent ideations   Homicidal Thoughts:  No  Memory:  recent and remote grossly intact  Judgement:  Other:  improving   Insight:  improving  Psychomotor Activity:  Normal  Concentration:  Concentration: Good and  Attention Span: Good  Recall:  Good  Fund of Knowledge:  Good  Language:  Good  Akathisia:  Negative  Handed:  Right  AIMS (if indicated):     Assets:  Communication Skills Desire for Improvement Resilience  ADL's:  Intact  Cognition:  WNL  Sleep:  Number of Hours: 6.75   Assessment : patient presents calm, with improving range of affect- no overt irritability or explosiveness . Thus far tolerating medications well . Husband , who visited her yesterday, corroborates improvement . Patient reports some marital difficulties which have been a contributor to mood symptoms and states that her husband and her are currently invested in treatment, going to couples/family therapy.   Treatment Plan Summary: Daily contact with patient to assess and evaluate symptoms and progress in treatment, Medication management, Plan inpatient treatment  and medications as below Encourage group and milieu participation to work on coping skills and symptom reduction Treatment team working on disposition planning options Continue Zyprexa 2.5 mgrs QHS for mood disorder- side effects reviewed  Continue Lexapro 10 mgrs QDAY for depression Continue Remeron 7.5  mgrs QHS for depression, insomnia Continue Vistaril 25 mgrs TID PRN for anxiety as needed  Jenne Campus, MD 04/12/2017, 9:04 AM

## 2017-04-12 NOTE — [facility] Counselor (Signed)
Adult Comprehensive Assessment  Patient ID: Amy Tanner, female   DOB: 1990/12/17, 26 y.o.   MRN: [id number]  Information Source: Information source: Patient  Current Stressors:  Educational / Learning stressors: None reported  Employment / Job issues: Currently employed at [employer] Relationships: None reported  Surveyor, quantity / Lack of resources (include bankruptcy): None reported  Housing / Lack of housing: Pt lives with husband and children.  Physical health (include injuries & life threatening diseases): None reported  Social relationships: None reported  Substance abuse: Denies use. Bereavement / Loss: None reported   Living/Environment/Situation:  Living Arrangements: Spouse/significant other, Children Living conditions (as described by patient or guardian): Pt lives with husband and children  What is atmosphere in current home: Comfortable, Paramedic, Supportive  Family History:  Marital status: Married Number of Years Married: 5 What types of issues is patient dealing with in the relationship?: None reported  Are you sexually active?: No What is your sexual orientation?: Heterosexual  Has your sexual activity been affected by drugs, alcohol, medication, or emotional stress?: None reported  Does patient have children?: Yes How many children?: 2 How is patient's relationship with their children?: Ages 4 and 15 months   Childhood History:  By whom was/is the patient raised?: Both parents Description of patient's relationship with caregiver when they were a child: Good relationship with parents  Patient's description of current relationship with people who raised him/her: Good relationship with parents  How were you disciplined when you got in trouble as a child/adolescent?: None reported  Does patient have siblings?: Yes Number of Siblings: 2 Description of patient's current relationship with siblings: Good relationship Did patient suffer any  verbal/emotional/physical/sexual abuse as a child?: No Did patient suffer from severe childhood neglect?: No Has patient ever been sexually abused/assaulted/raped as an adolescent or adult?: No Was the patient ever a victim of a crime or a disaster?: No Witnessed domestic violence?: No Has patient been effected by domestic violence as an adult?: No  Education:  Highest grade of school patient has completed: BSN  Currently a Consulting civil engineer?: No Learning disability?: No  Employment/Work Situation:   Employment situation: Employed Where is patient 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  Alcohol/Substance Abuse Treatment Hx: Denies past history Has alcohol/substance abuse ever caused legal problems?: No  Social Support System:   Patient's Community Support System: Good Describe Community Support System: family, friends  Type of faith/religion: Christainity  How does patient's faith help to cope with current illness?: prayer   Leisure/Recreation:   Leisure and Hobbies: anything outdoors, shopping, swimming   Strengths/Needs:   What things does the patient do well?: sports  In what areas does patient struggle / problems for patient: "feeling down"   Discharge Plan:   Does patient have access to transportation?: Yes Will patient be returning to same living situation after discharge?: Yes If no, would patient like referral for services when discharged?: Yes (What county?) ([place], [place] Does patient have financial  barriers related to discharge medications?: No  Summary/Recommendations:    Patient is a 26 year old female admitted  with a diagnosis of Major depression. Patient presented to the hospital with mood instability. Patient reports primary triggers for admission were postpartum depression. Patient will benefit from crisis stabilization, medication evaluation, group therapy and psycho education in addition to case management for discharge. At discharge, it is recommended that patient remain compliant with established discharge plan and continued treatment.   Rondall Allegra MSW, LCSW 04/12/2017

## 2017-04-12 NOTE — Progress Notes (Addendum)
Nursing Note 04/12/2017 [id number]  Data Reports sleeping good without PRN sleep med.  Rates depression 2/10, hopelessness 0/10, and anxiety 2/10. Affect wide ranged and appropriate.  Denies HI, SI, AVH.  Attending groups, polite and appropriate.  C/O ongoing cough.  Got CXR- NP ordered ABT and guaifenesin DM- patient reports improvement with guaifenesin DM.  Action Spoke with patient 1:1, nurse offered support to patient throughout shift. PRN for cough, started on ABT. Continues to be monitored on 15 minute checks for safety.  Response Remains safe on unit, reports improvement in cough with guaifenesin DM.

## 2017-04-12 NOTE — Plan of Care (Signed)
Problem: Activity: Goal: Interest or engagement in activities will improve Outcome: Progressing Patient is observed up playing cards with peers in the dayroom and attends groups.

## 2017-04-12 NOTE — Progress Notes (Signed)
[facility] Group Notes:  (Nursing/MHT/Case Management/Adjunct)  Date:  04/12/2017  Time:  1030 Type of Therapy:  Nurse Education  Participation Level:  Active  Participation Quality:  Appropriate  Affect:  Blunted and Depressed  Cognitive:  Alert and Oriented  Insight:  Improving  Engagement in Group:  Developing/Improving  Modes of Intervention:  Activity, Discussion, Education, Socialization and Support  Summary of Progress/Problems:The purpose of this group is to support patients in developing a goal for today and introduce them to the benefits of aromatherapy. Pt's goal for today is to work on coping skills for depression.   Beatrix Shipper 04/12/2017, 7:05 PM

## 2017-04-13 MED ORDER — ESCITALOPRAM OXALATE 10 MG PO TABS: 10.0000 mg | ORAL_TABLET | Freq: Every day | ORAL | 0 refills | Status: AC

## 2017-04-13 MED ORDER — MIRTAZAPINE 15 MG PO TABS: 15.0000 mg | ORAL_TABLET | Freq: Every day | ORAL | 0 refills | Status: AC

## 2017-04-13 MED ORDER — HYDROXYZINE HCL 25 MG PO TABS: 25.0000 mg | ORAL_TABLET | Freq: Three times a day (TID) | ORAL | 0 refills | Status: AC | PRN

## 2017-04-13 MED ORDER — AMOXICILLIN-POT CLAVULANATE 875-125 MG PO TABS: 1.0000 | ORAL_TABLET | Freq: Two times a day (BID) | ORAL | 0 refills | Status: AC

## 2017-04-13 MED ORDER — OLANZAPINE 2.5 MG PO TABS: 2.5000 mg | ORAL_TABLET | Freq: Every day | ORAL | 0 refills | Status: AC

## 2017-04-13 NOTE — Tx Team (Signed)
Interdisciplinary Treatment and Diagnostic Plan Update  04/13/2017 Time of Session: 9:30am Amy Tanner MRN: [id number]  Principal Diagnosis: MDD (major depressive disorder)  Secondary Diagnoses: Principal Problem:   MDD (major depressive disorder)   Current Medications:  Current Facility-Administered Medications  Medication Dose Route Frequency Provider Last Rate Last Dose  . acetaminophen (TYLENOL) tablet 650 mg  650 mg Oral Q6H PRN Okonkwo, Justina A, NP      . alum & mag hydroxide-simeth (MAALOX/MYLANTA) 200-200-20 MG/5ML suspension 30 mL  30 mL Oral Q4H PRN Okonkwo, Justina A, NP      . amoxicillin-clavulanate (AUGMENTIN) 875-125 MG per tablet 1 tablet  1 tablet Oral Q12H Armandina Stammer I, NP   1 tablet at 04/13/17 [phone]  . escitalopram (LEXAPRO) tablet 10 mg  10 mg Oral Daily Cobos, Rockey Situ, MD   10 mg at 04/13/17 0814  . guaiFENesin-dextromethorphan (ROBITUSSIN DM) 100-10 MG/5ML syrup 10 mL  10 mL Oral Q6H PRN Armandina Stammer I, NP   10 mL at 04/13/17 0816  . hydrOXYzine (ATARAX/VISTARIL) tablet 25 mg  25 mg Oral TID PRN Ferne Reus A, NP   25 mg at 04/12/17 2306  . magnesium hydroxide (MILK OF MAGNESIA) suspension 30 mL  30 mL Oral Daily PRN Okonkwo, Justina A, NP      . mirtazapine (REMERON) tablet 15 mg  15 mg Oral QHS Okonkwo, Justina A, NP   15 mg at 04/12/17 2133  . OLANZapine (ZYPREXA) tablet 2.5 mg  2.5 mg Oral QHS Cobos, Rockey Situ, MD   2.5 mg at 04/12/17 2133    PTA Medications: Prescriptions Prior to Admission  Medication Sig Dispense Refill Last Dose  . amoxicillin-clavulanate (AUGMENTIN) 875-125 MG tablet Take 1 tablet by mouth 2 (two) times daily.  0 04/09/2017  . predniSONE (DELTASONE) 20 MG tablet Take 20 mg by mouth 2 (two) times daily.  0 04/09/2017    Treatment Modalities: Medication Management, Group therapy, Case management,  1 to 1 session with clinician, Psychoeducation, Recreational therapy.  Patient Stressors: Marital or family conflict Other:  Postpartem Depression hx  Patient Strengths: Ability for insight Average or above average intelligence Capable of independent living Metallurgist fund of knowledge Physical Health Supportive family/friends  Physician Treatment Plan for Primary Diagnosis: MDD (major depressive disorder) Long Term Goal(s): Improvement in symptoms so as ready for discharge  Short Term Goals: Ability to identify changes in lifestyle to reduce recurrence of condition will improve Ability to verbalize feelings will improve Ability to demonstrate self-control will improve Ability to maintain clinical measurements within normal limits will improve Ability to identify changes in lifestyle to reduce recurrence of condition will improve Ability to identify and develop effective coping behaviors will improve Compliance with prescribed medications will improve Ability to identify triggers associated with substance abuse/mental health issues will improve  Medication Management: Evaluate patient's response, side effects, and tolerance of medication regimen.  Therapeutic Interventions: 1 to 1 sessions, Unit Group sessions and Medication administration.  Evaluation of Outcomes: Progressing  Physician Treatment Plan for Secondary Diagnosis: Principal Problem:   MDD (major depressive disorder)   Long Term Goal(s): Improvement in symptoms so as ready for discharge  Short Term Goals: Ability to identify changes in lifestyle to reduce recurrence of condition will improve Ability to verbalize feelings will improve Ability to demonstrate self-control will improve Ability to maintain clinical measurements within normal limits will improve Ability to identify changes in lifestyle to reduce recurrence of condition will improve Ability to identify and  develop effective coping behaviors will improve Compliance with prescribed medications will improve Ability to identify triggers  associated with substance abuse/mental health issues will improve  Medication Management: Evaluate patient's response, side effects, and tolerance of medication regimen.  Therapeutic Interventions: 1 to 1 sessions, Unit Group sessions and Medication administration.  Evaluation of Outcomes: Progressing   RN Treatment Plan for Primary Diagnosis: MDD (major depressive disorder) Long Term Goal(s): Knowledge of disease and therapeutic regimen to maintain health will improve  Short Term Goals: Ability to remain free from injury will improve, Ability to verbalize frustration and anger appropriately will improve, Ability to demonstrate self-control, Ability to participate in decision making will improve, Ability to verbalize feelings will improve, Ability to disclose and discuss suicidal ideas, Ability to identify and develop effective coping behaviors will improve and Compliance with prescribed medications will improve  Medication Management: RN will administer medications as ordered by provider, will assess and evaluate patient's response and provide education to patient for prescribed medication. RN will report any adverse and/or side effects to prescribing provider.  Therapeutic Interventions: 1 on 1 counseling sessions, Psychoeducation, Medication administration, Evaluate responses to treatment, Monitor vital signs and CBGs as ordered, Perform/monitor CIWA, COWS, AIMS and Fall Risk screenings as ordered, Perform wound care treatments as ordered.  Evaluation of Outcomes: Progressing   LCSW Treatment Plan for Primary Diagnosis: MDD (major depressive disorder) Long Term Goal(s): Safe transition to appropriate next level of care at discharge, Engage patient in therapeutic group addressing interpersonal concerns.  Short Term Goals: Engage patient in aftercare planning with referrals and resources, Identify triggers associated with mental health/substance abuse issues and Increase skills for wellness  and recovery  Therapeutic Interventions: Assess for all discharge needs, 1 to 1 time with Social worker, Explore available resources and support systems, Assess for adequacy in community support network, Educate family and significant other(s) on suicide prevention, Complete Psychosocial Assessment, Interpersonal group therapy.  Evaluation of Outcomes: Progressing   Progress in Treatment: Attending groups: Yes Participating in groups: Minimally Taking medication as prescribed: Yes, MD continues to assess for medication changes as needed Toleration medication: Yes, no side effects reported at this time Family/Significant other contact made: Yes with husband Patient understands diagnosis: Developing insight AEB willingness to seek outpatient tretament Discussing patient identified problems/goals with staff: Yes Medical problems stabilized or resolved: Yes Denies suicidal/homicidal ideation: Yes Issues/concerns per patient self-inventory: None Other: N/A  New problem(s) identified: None identified at this time.   New Short Term/Long Term Goal(s): "get my mood more stable and feel more like myself"  Discharge Plan or Barriers: Pt will return home and follow-up with [facility]  Reason for Continuation of Hospitalization: Anxiety Depression Medication stabilization  Estimated Length of Stay: 0 days; Pt stable for DC today  Attendees: Patient: Amy Tanner 04/13/2017  8:46 AM  Physician: Dr. Jama Flavorsobos, MD 04/13/2017  8:46 AM  Nursing: Rayfield Citizenaroline, RN 04/13/2017  8:46 AM  RN Care Manager:  04/13/2017  8:46 AM  Social Worker: Vernie ShanksLauren Adri Schloss, LCSW 04/13/2017  8:46 AM  Recreational Therapist:  04/13/2017  8:46 AM  Other:  04/13/2017  8:46 AM  Other:  04/13/2017  8:46 AM  Other: 04/13/2017  8:46 AM    Scribe for Treatment Team: Verdene LennertLauren C Dulcey Riederer, LCSW 04/13/2017 8:46 AM

## 2017-04-13 NOTE — [facility] Suicide Risk Assessment (Signed)
[facility] Discharge Suicide Risk Assessment   Principal Problem: MDD (major depressive disorder) Discharge Diagnoses:  Patient Active Problem List   Diagnosis Date Noted  . MDD (major depressive disorder) [F32.9] 04/10/2017    Total Time spent with patient: 30 minutes  Musculoskeletal: Strength & Muscle Tone: within normal limits Gait & Station: normal Patient leans: N/A  Psychiatric Specialty Exam: Review of Systems  Neurological: Seizures: 13 y.   no fever, no chills, no dyspnea, no chest pain, cough significantly improved, no nausea, no diarrhea, no fever, no chills  Blood pressure 110/62, pulse 67, temperature 97.9 F (36.6 C), temperature source Oral, resp. rate 20, height 5\' 7"  (1.702 m), weight 81.6 kg (180 lb), last menstrual period 04/12/2017, SpO2 96 %.Body mass index is 28.19 kg/m.  General Appearance: Well Groomed  Eye Contact::  Good  Speech:  Normal Rate409  Volume:  Normal  Mood:  improved, currently denies feeling depressed, presents euthymic at this time  Affect:  reactive, fuller in range   Thought Process:  Linear and Descriptions of Associations: Intact  Orientation:  Full (Time, Place, and Person)  Thought Content:  denies hallucinations, no delusions, not internally preoccupied   Suicidal Thoughts:  No at this time denies any suicidal or self injurious ideations, denies any violent or homicidal ideations   Homicidal Thoughts:  No  Memory:  recent and remote grossly intact   Judgement:  Other:  improving   Insight:  improving   Psychomotor Activity:  Normal  Concentration:  Good  Recall:  Good  Fund of Knowledge:Good  Language: Good  Akathisia:  Negative  Handed:  Right  AIMS (if indicated):     Assets:  Communication Skills Desire for Improvement Resilience Social Support Talents/Skills  Sleep:  Number of Hours: 6.25  Cognition: WNL  ADL's:  Intact   Mental Status Per Nursing Assessment::   On Admission:  NA  Demographic Factors:  26 year old  married female, employed, has two children, lives with husband and children  Loss Factors: Marital issues, postpartum depression, recent respiratory infection  Historical Factors: No prior psychiatric admissions   Risk Reduction Factors:   Responsible for children under 78 years of age, Sense of responsibility to family, Employed, Living with another person, especially a relative and Positive coping skills or problem solving skills  Continued Clinical Symptoms:  At this time patient is alert, attentive, well related, pleasant, reports her mood is improved, and presents euthymic at this time, affect is appropriate, reactive , no thought disorder, no suicidal or self injurious ideations, no homicidal or violent ideations, no psychotic symptoms, future oriented . Of note, coughing has improved, and no coughing or shortness of breath noted at this time. She is not febrile.  Chest X Ray reviewed - reported as patchy atelectasis- patient was restarted on antibiotic course .  Behavior on unit in good control  Cognitive Features That Contribute To Risk:  No gross cognitive deficits noted upon discharge. Is alert , attentive, and oriented x 3   Suicide Risk:  Mild:  Suicidal ideation of limited frequency, intensity, duration, and specificity.  There are no identifiable plans, no associated intent, mild dysphoria and related symptoms, good self-control (both objective and subjective assessment), few other risk factors, and identifiable protective factors, including available and accessible social support.  Follow-up Information    [facility] Follow up.   Contact information: [address] [place], [place] [address] Phone: [phone]       BEHAVIORAL HEALTH OUTPATIENT  THERAPY [facility] Follow up.   [facility]  [facility] Contact information: [address] [id number] [place] [id number] [phone]       Medicine, [facility]  Follow up on 04/21/2017.   Specialty:  Behavioral Health Why:  at 1:00pm for therapy with Sue LushBren Blackwell. Please arrive at 12:30 to complete paperwork.  Contact information: [address] [place] [id number] [phone]           Plan Of Care/Follow-up recommendations:  Activity:  as tolerated  Diet:  Regular Tests:  NA Other:  See below Patient is expressing readiness for discharge, no current grounds for ongoing voluntary commitment  She is leaving unit in good spirits States husband will be picking her up later today Plans to follow up with her PCP for ongoing medical management and as above for ongoing outpatient psychiatric management. In addition, states her husband and her are currently going to couples counseling treatment.   Craige CottaFernando A Cobos, MD 04/13/2017, 10:44 AM

## 2017-04-13 NOTE — Progress Notes (Signed)
Discharge note: Patient discharged home per MD order.  Patient received all personal belongings from room and locker.  Reviewed AVS/transition record with patient and she indicated understanding.  Patient denies any thoughts of self harm.  Patient rates her anxiety and depression as a 2; she denies any hopelessness.  Patient left with prescriptions of her medications.  She left ambulatory with her husband.

## 2017-04-13 NOTE — Progress Notes (Signed)
Recreation Therapy Notes  Date:  04/13/17 Time: 0930 Location: [address] Dayroom  Group Topic: Stress Management  Goal Area(s) Addresses:  Patient will verbalize importance of using healthy stress management.  Patient will identify positive emotions associated with healthy stress management.   Intervention: Stress Management  Activity :  Guided Imagery.  LRT introduced the stress management technique of guided imagery.  LRT read a script so patients could follow along and imagine being in their peaceful place.  Patients were to follow along as LRT read the script to engage in the activity.  Education: Stress Management, Discharge Planning.   Education Outcome: Acknowledges edcuation/In group clarification offered/Needs additional education  Clinical Observations/Feedback:  Pt did not attend group.    Lakedra [place], LRT/CTRS        Bearett Porcaro A 04/13/2017 12:25 PM 

## 2017-04-13 NOTE — Progress Notes (Signed)
  [facility] Case Management Discharge Plan :  Will you be returning to the same living situation after discharge:  Yes,  Pt returning home At discharge, do you have transportation home?: Yes,  Pt husband to pick up Do you have the ability to pay for your medications: Yes,  pt provided with prescriptions  Release of information consent forms completed and in the chart;  Patient's signature needed at discharge.  Patient to Follow up at: Follow-up Information    [facility] Follow up on 05/04/2017.   Why:  at 10:15am for medication management with your primary care doctor.  Contact information: [address] [place], [place] [address] Phone: [phone]       BEHAVIORAL HEALTH OUTPATIENT THERAPY [facility] Follow up.   Specialty:  Behavioral Health Why:  CSW will contact you with first psychiatry appointment.  Contact information: [address] [id number] [place] [phone]       Medicine, [facility] Follow up on 04/21/2017.   Specialty:  Behavioral Health Why:  at 1:00pm for therapy with Sue LushBren Blackwell. Please arrive at 12:30 to complete paperwork.  Contact information: [address] [place] [id number] [phone]           Next level of care provider has access to [facility] Link:no  Safety Planning and Suicide Prevention discussed: Yes,  with husband; see SPE note  Have you used any form of tobacco in the last 30 days? (Cigarettes, Smokeless Tobacco, Cigars, and/or Pipes): Yes  Has patient been referred to the Quitline?: Patient refused referral  Patient has been referred for addiction treatment: N/A  Verdene LennertLauren C Sudais Banghart, LCSW 04/13/2017, 11:46 AM

## 2017-04-13 NOTE — Discharge Summary (Signed)
Physician Discharge Summary Note  Patient:  Amy Tanner is an 26 y.o., female MRN:  [id number] DOB:  06-18-1991 Patient phone:  [phone] (home)  Patient address:   [address] [place] [address],  Total Time spent with patient: Greater than 30 minutes  Date of Admission:  04/10/2017 Date of Discharge: 04-13-17  Reason for Admission: Worsening symptoms depression & anxiety.  Principal Problem: MDD (major depressive disorder)  Discharge Diagnoses: Patient Active Problem List   Diagnosis Date Noted  . MDD (major depressive disorder) [F32.9] 04/10/2017   Past Psychiatric History: Major depression.  Past Medical History: History reviewed. No pertinent past medical history. History reviewed. No pertinent surgical history.  Family History: History reviewed. No pertinent family history.  Family Psychiatric  History: See H&P  Social History:  History  Alcohol use Not on file     History  Drug use: Unknown    Social History   Social History  . Marital status: Married    Spouse name: N/A  . Number of children: N/A  . Years of education: N/A   Social History Main Topics  . Smoking status: Current Every Day Smoker  . Smokeless tobacco: Never Used  . Alcohol use None  . Drug use: Unknown  . Sexual activity: Not Asked   Other Topics Concern  . None   Social History Narrative  . None   Hospital Course: Amy Tanner a 26year old female who presents IVC,in no acute distress,for the treatment of Depression and Anxiety. Patientappears pleasant and anxious on admission. Patientwas cooperative with admission process. Patient denies SI and contracts for safety upon admission. Patientdenies AVH. 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 Patient reports that she thinks that she may have "Bipolar". Patient reports "I  have been having mood swings lately. I have my highs and lows. I'm happy and then depressed".  After the above admission assessment, Amy Tanner was started on the medication regimen for her presenting symptoms. She received & was discharged on Lexapro 10 mg for depression, Hydroxyzine 25 mg prn for anxiety, Mirtazapine 15 mg for depression/anxiety & Olanzapine 2.5 mg for mood control. She was enrolled & participated in the group counseling sessions being offered & held on this unit. She learned coping skills. She also received/discharged on other medications for the other medical issues presented. She tolerated her treatment regimen without any adverse effects or reactions reported.   Amy Tanner is seen today by the attending psychiatrist for discharge. She says she has normal anxiety about going home. She is not overwhelmed by this. She is looking forward to working on her mental health issues. Not expressing any delusions today. No hallucinations. Feels in control of herself. No passivity of thought. No passivity of will. No fantasy about suicide lately. No suicidal thoughts. Looking forward to getting back to work & life. No thoughts of violence. No craving for substances. Does not feel depressed. No evidence of mania.  The nursing staff reports that patient has been appropriate on the unit. Patient has been interacting well with peers. No behavioral issues. Patient has not voiced any suicidal thoughts. Patient has not been observed to be internally stimulated or preoccupied. Patient has been adherent with treatment recommendations. Patient has been tolerating her medications well. No reported adverse effects or reactions.   Patient was discussed at the treatment team meeting this morning. The team members  feel that patient is back to her baseline level of function. Team agrees with plan to discharge patient today to continue mental health health care on an outpatient bais. Amy Tanner was provided with a 7 days worth,  supply samples of her [facility] discharge medications. She left [facility] with all personal belongings in no apparent distress. Transportation per husband.  Physical Findings: AIMS: Facial and Oral Movements Muscles of Facial Expression: None, normal Lips and Perioral Area: None, normal Jaw: None, normal Tongue: None, normal,Extremity Movements Upper (arms, wrists, hands, fingers): None, normal Lower (legs, knees, ankles, toes): None, normal, Trunk Movements Neck, shoulders, hips: None, normal, Overall Severity Severity of abnormal movements (highest score from questions above): None, normal Incapacitation due to abnormal movements: None, normal Patient's awareness of abnormal movements (rate only patient's report): No Awareness, Dental Status Current problems with teeth and/or dentures?: No Does patient usually wear dentures?: No  CIWA:    COWS:     Musculoskeletal: Strength & Muscle Tone: within normal limits Gait & Station: normal Patient leans: N/A  Psychiatric Specialty Exam: Physical Exam  Review of Systems  Constitutional: Negative.   HENT: Negative.   Eyes: Negative.   Respiratory: Negative.   Cardiovascular: Negative.   Gastrointestinal: Negative.   Genitourinary: Negative.   Musculoskeletal: Negative.   Skin: Negative.   Neurological: Negative.   Endo/Heme/Allergies: Negative.   Psychiatric/Behavioral: Positive for depression (Stable). Negative for hallucinations, memory loss, substance abuse and suicidal ideas. The patient has insomnia (Stable). The patient is not nervous/anxious.     Blood pressure 110/62, pulse 67, temperature 97.9 F (36.6 C), temperature source Oral, resp. rate 20, height 5\' 7"  (1.702 m), weight 81.6 kg (180 lb), last menstrual period 04/12/2017, SpO2 96 %.Body mass index is 28.19 kg/m.  See Md's SRA   Have you used any form of tobacco in the last 30 days? (Cigarettes, Smokeless Tobacco, Cigars, and/or Pipes): Yes  Has this patient used any form of  tobacco in the last 30 days? (Cigarettes, Smokeless Tobacco, Cigars, and/or Pipes): No  Blood Alcohol level:  No results found for: [facility]  Metabolic Disorder Labs:  No results found for: HGBA1C, MPG No results found for: PROLACTIN No results found for: CHOL, TRIG, HDL, CHOLHDL, VLDL, LDLCALC  See Psychiatric Specialty Exam and Suicide Risk Assessment completed by Attending Physician prior to discharge.  Discharge destination:  Home  Is patient on multiple antipsychotic therapies at discharge:  No   Has Patient had three or more failed trials of antipsychotic monotherapy by history:  No  Recommended Plan for Multiple Antipsychotic Therapies: NA  Allergies as of 04/13/2017      Reactions   Codeine Anaphylaxis   Grapefruit Concentrate Rash      Medication List    STOP taking these medications   predniSONE 20 MG tablet Commonly known as:  DELTASONE     TAKE these medications     Indication  amoxicillin-clavulanate 875-125 MG tablet Commonly known as:  AUGMENTIN Take 1 tablet by mouth every 12 (twelve) hours. For infection What changed:  when to take this  additional instructions  Indication:  Lower Respiratory Tract Infection   escitalopram 10 MG tablet Commonly known as:  LEXAPRO Take 1 tablet (10 mg total) by mouth daily. For depression  Indication:  Major Depressive Disorder   hydrOXYzine 25 MG tablet Commonly known as:  ATARAX/VISTARIL Take 1 tablet (25 mg total) by mouth 3 (three) times daily as needed for anxiety.  Indication:  Feeling Anxious   mirtazapine 15 MG  tablet Commonly known as:  REMERON Take 1 tablet (15 mg total) by mouth at bedtime. For depression/insomnia  Indication:  Major Depressive Disorder, Insomnia   OLANZapine 2.5 MG tablet Commonly known as:  ZYPREXA Take 1 tablet (2.5 mg total) by mouth at bedtime. For mood control  Indication:  Mood control      Follow-up Information    [facility] Follow up.   Contact  information: [address] [place], [place] [address] Phone: [phone]       [facility] [place] Follow up.   [facility]  [facility] Contact information: [address] [id number] [place] [id number] [phone]       Medicine, [facility] Follow up on 04/21/2017.   Specialty:  Behavioral Health Why:  at 1:00pm for therapy with Sue LushBren Blackwell. Please arrive at 12:30 to complete paperwork.  Contact information: [address] [place] [address] [phone]          Follow-up recommendations: Activity:  As tolerated Diet: As recommended by your primary care doctor. Keep all scheduled follow-up appointments as recommended.    Comments: Patient is instructed prior to discharge to: Take all medications as prescribed by his/her mental healthcare provider. Report any adverse effects and or reactions from the medicines to his/her outpatient provider promptly. Patient has been instructed & cautioned: To not engage in alcohol and or illegal drug use while on prescription medicines. In the event of worsening symptoms, patient is instructed to call the crisis hotline, 911 and or go to the nearest ED for appropriate evaluation and treatment of symptoms. To follow-up with his/her primary care provider for your other medical issues, concerns and or health care needs.   Signed: Sanjuana KavaNwoko, Agnes I, NP, PMHNP, FNP-BC 04/13/2017, 10:55 AM   Patient seen, Suicide Assessment Completed.  Disposition Plan Reviewed

## 2017-04-13 NOTE — [facility] Suicide Risk Assessment (Signed)
[facility] INPATIENT:  Family/Significant Other Suicide Prevention Education  Suicide Prevention Education:  Education Completed; Pricila Gin, Pt's husband [phone], has been identified by the patient as the family member/significant other with whom the patient will be residing, and identified as the person(s) who will aid the patient in the event of a mental health crisis (suicidal ideations/suicide attempt).  With written consent from the patient, the family member/significant other has been provided the following suicide prevention education, prior to the and/or following the discharge of the patient.  The suicide prevention education provided includes the following:  Suicide risk factors  Suicide prevention and interventions  National Suicide Hotline telephone number  [facility] assessment telephone number  [facility] Emergency Assistance 911  [facility] and/or Residential Mobile Crisis Unit telephone number  Request made of family/significant other to:  Remove weapons (e.g., guns, rifles, knives), all items previously/currently identified as safety concern.    Remove drugs/medications (over-the-counter, prescriptions, illicit drugs), all items previously/currently identified as a safety concern.  The family member/significant other verbalizes understanding of the suicide prevention education information provided.  The family member/significant other agrees to remove the items of safety concern listed above.  Pt's husband expresses support of Pt as she returns home. He reports that he feels Pt has made progress while admitted and that he will be involved in her car as she returns home.   Verdene Lennert 04/13/2017, 9:35 AM

## 2017-04-14 NOTE — [facility] Group Notes (Signed)
Pt attended spiritual care group on grief and loss facilitated by chaplain Sircharles Holzheimer   Group opened with brief discussion and psycho-social ed around grief and loss in relationships and in relation to self - identifying life patterns, circumstances, changes that cause losses. Established group norm of speaking from own life experience. Group goal of establishing open and affirming space for members to share loss and experience with grief, normalize grief experience and provide psycho social education and grief support.     

## 2017-04-25 ENCOUNTER — Telehealth (HOSPITAL_COMMUNITY): Payer: Self-pay | Admitting: Family Medicine

## 2017-06-09 ENCOUNTER — Ambulatory Visit (HOSPITAL_COMMUNITY): Payer: Commercial Managed Care - PPO | Admitting: Psychiatry

## 2017-06-13 ENCOUNTER — Ambulatory Visit (HOSPITAL_COMMUNITY): Payer: Commercial Managed Care - PPO | Admitting: Psychiatry

## 2017-09-12 ENCOUNTER — Ambulatory Visit (HOSPITAL_COMMUNITY): Payer: Self-pay | Admitting: Psychiatry

## 2017-10-05 DIAGNOSIS — J40 Bronchitis, not specified as acute or chronic: Secondary | ICD-10-CM | POA: Diagnosis not present

## 2017-10-10 DIAGNOSIS — J209 Acute bronchitis, unspecified: Secondary | ICD-10-CM | POA: Diagnosis not present

## 2017-10-10 DIAGNOSIS — H66009 Acute suppurative otitis media without spontaneous rupture of ear drum, unspecified ear: Secondary | ICD-10-CM | POA: Diagnosis not present

## 2017-11-15 DIAGNOSIS — Z3A28 28 weeks gestation of pregnancy: Secondary | ICD-10-CM | POA: Diagnosis not present

## 2017-11-15 DIAGNOSIS — O99810 Abnormal glucose complicating pregnancy: Secondary | ICD-10-CM | POA: Diagnosis not present

## 2017-11-15 DIAGNOSIS — Z3689 Encounter for other specified antenatal screening: Secondary | ICD-10-CM | POA: Diagnosis not present

## 2017-11-15 DIAGNOSIS — R7302 Impaired glucose tolerance (oral): Secondary | ICD-10-CM | POA: Diagnosis not present

## 2017-11-17 DIAGNOSIS — R7302 Impaired glucose tolerance (oral): Secondary | ICD-10-CM | POA: Diagnosis not present

## 2017-12-01 DIAGNOSIS — Z23 Encounter for immunization: Secondary | ICD-10-CM | POA: Diagnosis not present

## 2017-12-26 DIAGNOSIS — Z3483 Encounter for supervision of other normal pregnancy, third trimester: Secondary | ICD-10-CM | POA: Diagnosis not present

## 2017-12-26 DIAGNOSIS — Z3482 Encounter for supervision of other normal pregnancy, second trimester: Secondary | ICD-10-CM | POA: Diagnosis not present

## 2018-01-05 DIAGNOSIS — Z3689 Encounter for other specified antenatal screening: Secondary | ICD-10-CM | POA: Diagnosis not present

## 2018-02-02 DIAGNOSIS — Z302 Encounter for sterilization: Secondary | ICD-10-CM | POA: Diagnosis not present

## 2018-02-02 DIAGNOSIS — O34211 Maternal care for low transverse scar from previous cesarean delivery: Secondary | ICD-10-CM | POA: Diagnosis not present

## 2018-02-02 DIAGNOSIS — N858 Other specified noninflammatory disorders of uterus: Secondary | ICD-10-CM | POA: Diagnosis not present

## 2018-02-02 DIAGNOSIS — Z87891 Personal history of nicotine dependence: Secondary | ICD-10-CM | POA: Diagnosis not present

## 2018-02-02 DIAGNOSIS — Z3A39 39 weeks gestation of pregnancy: Secondary | ICD-10-CM | POA: Diagnosis not present

## 2020-08-25 ENCOUNTER — Other Ambulatory Visit: Payer: Self-pay

## 2020-08-25 DIAGNOSIS — Z20822 Contact with and (suspected) exposure to covid-19: Secondary | ICD-10-CM

## 2020-08-28 LAB — NOVEL CORONAVIRUS, NAA: SARS-CoV-2, NAA: NOT DETECTED

## 2021-12-29 ENCOUNTER — Other Ambulatory Visit (HOSPITAL_COMMUNITY): Payer: Self-pay

## 2021-12-29 MED ORDER — ESCITALOPRAM OXALATE 10 MG PO TABS
ORAL_TABLET | ORAL | 0 refills | Status: AC
  Filled 2021-12-29: qty 90, 90d supply, fill #0

## 2023-03-03 DIAGNOSIS — Z01411 Encounter for gynecological examination (general) (routine) with abnormal findings: Secondary | ICD-10-CM | POA: Diagnosis not present

## 2023-03-03 DIAGNOSIS — Z13228 Encounter for screening for other metabolic disorders: Secondary | ICD-10-CM | POA: Diagnosis not present

## 2023-03-03 DIAGNOSIS — E559 Vitamin D deficiency, unspecified: Secondary | ICD-10-CM | POA: Diagnosis not present

## 2023-03-03 DIAGNOSIS — R5383 Other fatigue: Secondary | ICD-10-CM | POA: Diagnosis not present

## 2023-03-03 DIAGNOSIS — Z131 Encounter for screening for diabetes mellitus: Secondary | ICD-10-CM | POA: Diagnosis not present

## 2023-03-03 DIAGNOSIS — Z130 Encounter for screening for diseases of the blood and blood-forming organs and certain disorders involving the immune mechanism: Secondary | ICD-10-CM | POA: Diagnosis not present

## 2023-03-03 DIAGNOSIS — Z1321 Encounter for screening for nutritional disorder: Secondary | ICD-10-CM | POA: Diagnosis not present

## 2023-03-03 DIAGNOSIS — Z1389 Encounter for screening for other disorder: Secondary | ICD-10-CM | POA: Diagnosis not present

## 2023-10-14 DIAGNOSIS — R002 Palpitations: Secondary | ICD-10-CM | POA: Diagnosis not present

## 2023-10-14 DIAGNOSIS — Z1331 Encounter for screening for depression: Secondary | ICD-10-CM | POA: Diagnosis not present

## 2023-10-14 DIAGNOSIS — Z131 Encounter for screening for diabetes mellitus: Secondary | ICD-10-CM | POA: Diagnosis not present

## 2023-10-14 DIAGNOSIS — Z13220 Encounter for screening for lipoid disorders: Secondary | ICD-10-CM | POA: Diagnosis not present

## 2023-10-14 DIAGNOSIS — Z6826 Body mass index (BMI) 26.0-26.9, adult: Secondary | ICD-10-CM | POA: Diagnosis not present

## 2023-10-14 DIAGNOSIS — Z1339 Encounter for screening examination for other mental health and behavioral disorders: Secondary | ICD-10-CM | POA: Diagnosis not present

## 2023-10-14 DIAGNOSIS — Z0000 Encounter for general adult medical examination without abnormal findings: Secondary | ICD-10-CM | POA: Diagnosis not present

## 2023-10-14 DIAGNOSIS — F4322 Adjustment disorder with anxiety: Secondary | ICD-10-CM | POA: Diagnosis not present

## 2023-11-22 ENCOUNTER — Other Ambulatory Visit (HOSPITAL_COMMUNITY): Payer: Self-pay

## 2023-11-22 DIAGNOSIS — Z6827 Body mass index (BMI) 27.0-27.9, adult: Secondary | ICD-10-CM | POA: Diagnosis not present

## 2023-11-22 DIAGNOSIS — F4322 Adjustment disorder with anxiety: Secondary | ICD-10-CM | POA: Diagnosis not present

## 2023-11-22 MED ORDER — VENLAFAXINE HCL ER 75 MG PO TB24
1.0000 | ORAL_TABLET | Freq: Every day | ORAL | 1 refills | Status: DC
  Filled 2023-11-22: qty 90, 90d supply, fill #0
  Filled 2023-12-19: qty 30, 30d supply, fill #0

## 2023-11-26 ENCOUNTER — Other Ambulatory Visit (HOSPITAL_COMMUNITY): Payer: Self-pay

## 2023-12-02 ENCOUNTER — Other Ambulatory Visit: Payer: Self-pay

## 2023-12-02 ENCOUNTER — Other Ambulatory Visit (HOSPITAL_COMMUNITY): Payer: Self-pay

## 2023-12-03 ENCOUNTER — Other Ambulatory Visit (HOSPITAL_COMMUNITY): Payer: Self-pay

## 2023-12-05 ENCOUNTER — Other Ambulatory Visit (HOSPITAL_COMMUNITY): Payer: Self-pay

## 2023-12-19 ENCOUNTER — Other Ambulatory Visit (HOSPITAL_COMMUNITY): Payer: Self-pay

## 2023-12-19 MED ORDER — VENLAFAXINE HCL ER 75 MG PO TB24
1.0000 | ORAL_TABLET | Freq: Every day | ORAL | 1 refills | Status: DC
  Filled 2023-12-19: qty 90, 90d supply, fill #0

## 2023-12-20 ENCOUNTER — Other Ambulatory Visit (HOSPITAL_COMMUNITY): Payer: Self-pay

## 2023-12-22 ENCOUNTER — Other Ambulatory Visit (HOSPITAL_COMMUNITY): Payer: Self-pay

## 2023-12-28 ENCOUNTER — Other Ambulatory Visit (HOSPITAL_COMMUNITY): Payer: Self-pay

## 2023-12-30 ENCOUNTER — Other Ambulatory Visit (HOSPITAL_COMMUNITY): Payer: Self-pay

## 2024-01-02 ENCOUNTER — Other Ambulatory Visit (HOSPITAL_COMMUNITY): Payer: Self-pay

## 2024-01-02 MED ORDER — VENLAFAXINE HCL ER 75 MG PO TB24
75.0000 mg | ORAL_TABLET | Freq: Every day | ORAL | 1 refills | Status: DC
  Filled 2024-01-02: qty 90, 90d supply, fill #0

## 2024-01-03 ENCOUNTER — Other Ambulatory Visit (HOSPITAL_COMMUNITY): Payer: Self-pay

## 2024-01-03 MED ORDER — VENLAFAXINE HCL ER 75 MG PO TB24: 1.0000 | ORAL_TABLET | Freq: Every day | ORAL | 1 refills | Status: AC

## 2024-01-05 ENCOUNTER — Other Ambulatory Visit (HOSPITAL_COMMUNITY): Payer: Self-pay

## 2024-01-05 ENCOUNTER — Other Ambulatory Visit: Payer: Self-pay

## 2024-01-05 MED ORDER — VENLAFAXINE HCL ER 75 MG PO CP24
75.0000 mg | ORAL_CAPSULE | Freq: Every day | ORAL | 1 refills | Status: DC
  Filled 2024-01-05: qty 90, 90d supply, fill #0
  Filled 2024-03-19: qty 90, 90d supply, fill #1

## 2024-02-09 ENCOUNTER — Other Ambulatory Visit (HOSPITAL_COMMUNITY): Payer: Self-pay

## 2024-03-19 ENCOUNTER — Other Ambulatory Visit (HOSPITAL_COMMUNITY): Payer: Self-pay

## 2024-05-23 ENCOUNTER — Other Ambulatory Visit (HOSPITAL_COMMUNITY): Payer: Self-pay

## 2024-05-23 DIAGNOSIS — F4322 Adjustment disorder with anxiety: Secondary | ICD-10-CM | POA: Diagnosis not present

## 2024-05-23 DIAGNOSIS — Z6830 Body mass index (BMI) 30.0-30.9, adult: Secondary | ICD-10-CM | POA: Diagnosis not present

## 2024-05-23 MED ORDER — VENLAFAXINE HCL ER 75 MG PO CP24
75.0000 mg | ORAL_CAPSULE | Freq: Every day | ORAL | 1 refills | Status: AC
  Filled 2024-05-23 – 2024-06-23 (×2): qty 90, 90d supply, fill #0

## 2024-06-23 ENCOUNTER — Other Ambulatory Visit (HOSPITAL_COMMUNITY): Payer: Self-pay

## 2024-06-27 ENCOUNTER — Other Ambulatory Visit: Payer: Self-pay

## 2024-06-28 ENCOUNTER — Other Ambulatory Visit (HOSPITAL_COMMUNITY): Payer: Self-pay
